# Patient Record
Sex: Male | Born: 1985 | Race: White | Hispanic: No | Marital: Single
Health system: Southern US, Community
[De-identification: ages and names within clinical notes are randomized; demographics above are authoritative.]

---

## 2018-08-04 ENCOUNTER — Emergency Department
Admission: EM | Admit: 2018-08-04 | Discharge: 2018-08-04 | Disposition: A | Discharge status: Home / Self Care | Payer: BLUE CROSS/BLUE SHIELD | Attending: Emergency Medicine | Admitting: Emergency Medicine

## 2018-08-04 ENCOUNTER — Emergency Department: Payer: BLUE CROSS/BLUE SHIELD

## 2018-08-04 ENCOUNTER — Encounter: Payer: Self-pay | Admitting: Emergency Medicine

## 2018-08-04 ENCOUNTER — Other Ambulatory Visit: Payer: Self-pay

## 2018-08-04 DIAGNOSIS — S29019A Strain of muscle and tendon of unspecified wall of thorax, initial encounter: Secondary | ICD-10-CM | POA: Insufficient documentation

## 2018-08-04 DIAGNOSIS — Y92410 Unspecified street and highway as the place of occurrence of the external cause: Secondary | ICD-10-CM | POA: Diagnosis not present

## 2018-08-04 DIAGNOSIS — Y9389 Activity, other specified: Secondary | ICD-10-CM | POA: Diagnosis not present

## 2018-08-04 DIAGNOSIS — Y999 Unspecified external cause status: Secondary | ICD-10-CM | POA: Insufficient documentation

## 2018-08-04 DIAGNOSIS — T148XXA Other injury of unspecified body region, initial encounter: Secondary | ICD-10-CM

## 2018-08-04 DIAGNOSIS — S299XXA Unspecified injury of thorax, initial encounter: Secondary | ICD-10-CM | POA: Diagnosis present

## 2018-08-04 MED ORDER — KETOROLAC TROMETHAMINE 30 MG/ML IJ SOLN
30.0000 mg | Freq: Once | INTRAMUSCULAR | Status: AC
  Administered 2018-08-04: 30 mg via INTRAMUSCULAR
  Filled 2018-08-04: qty 1

## 2018-08-04 MED ORDER — LIDOCAINE 5 % EX PTCH: 1.0000 | MEDICATED_PATCH | Freq: Two times a day (BID) | CUTANEOUS | 0 refills | Status: AC

## 2018-08-04 MED ORDER — HYDROCODONE-ACETAMINOPHEN 5-325 MG PO TABS: 1.0000 | ORAL_TABLET | Freq: Four times a day (QID) | ORAL | 0 refills | Status: AC | PRN

## 2018-08-04 MED ORDER — IBUPROFEN 600 MG PO TABS: 600.0000 mg | ORAL_TABLET | Freq: Three times a day (TID) | ORAL | 0 refills | Status: AC | PRN

## 2018-08-04 MED ORDER — LIDOCAINE 5 % EX PTCH
1.0000 | MEDICATED_PATCH | Freq: Once | CUTANEOUS | Status: DC
  Administered 2018-08-04: 1 via TRANSDERMAL
  Filled 2018-08-04: qty 1

## 2018-08-04 MED ORDER — HYDROCODONE-ACETAMINOPHEN 5-325 MG PO TABS
2.0000 | ORAL_TABLET | Freq: Once | ORAL | Status: AC
  Administered 2018-08-04: 2 via ORAL
  Filled 2018-08-04: qty 2
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
## 2018-08-04 NOTE — ED Notes (Signed)
Patient discharged to home per MD order. Patient in stable condition, and deemed medically cleared by ED provider for discharge. Discharge instructions reviewed with patient/family using "Teach Back"; verbalized understanding of medication education and administration, and information about follow-up care. Denies further concerns. ° °
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
## 2023-06-22 ENCOUNTER — Emergency Department: Payer: Self-pay

## 2023-06-22 ENCOUNTER — Other Ambulatory Visit: Payer: Self-pay

## 2023-06-22 ENCOUNTER — Emergency Department
Admission: EM | Admit: 2023-06-22 | Discharge: 2023-06-22 | Disposition: A | Discharge status: Home / Self Care | Payer: Self-pay | Attending: Emergency Medicine | Admitting: Emergency Medicine

## 2023-06-22 DIAGNOSIS — N200 Calculus of kidney: Secondary | ICD-10-CM | POA: Insufficient documentation

## 2023-06-22 LAB — CBC WITH DIFFERENTIAL/PLATELET
Abs Immature Granulocytes: 0.02 10*3/uL (ref 0.00–0.07)
Basophils Absolute: 0.1 10*3/uL (ref 0.0–0.1)
Basophils Relative: 1 %
Eosinophils Absolute: 0.1 10*3/uL (ref 0.0–0.5)
Eosinophils Relative: 2 %
HCT: 45.1 % (ref 39.0–52.0)
Hemoglobin: 16 g/dL (ref 13.0–17.0)
Immature Granulocytes: 0 %
Lymphocytes Relative: 20 %
Lymphs Abs: 1.5 10*3/uL (ref 0.7–4.0)
MCH: 34.9 pg — ABNORMAL HIGH (ref 26.0–34.0)
MCHC: 35.5 g/dL (ref 30.0–36.0)
MCV: 98.3 fL (ref 80.0–100.0)
Monocytes Absolute: 0.8 10*3/uL (ref 0.1–1.0)
Monocytes Relative: 10 %
Neutro Abs: 5 10*3/uL (ref 1.7–7.7)
Neutrophils Relative %: 67 %
Platelets: 108 10*3/uL — ABNORMAL LOW (ref 150–400)
RBC: 4.59 MIL/uL (ref 4.22–5.81)
RDW: 12.8 % (ref 11.5–15.5)
WBC: 7.4 10*3/uL (ref 4.0–10.5)
nRBC: 0 % (ref 0.0–0.2)

## 2023-06-22 LAB — COMPREHENSIVE METABOLIC PANEL
ALT: 62 U/L — ABNORMAL HIGH (ref 0–44)
AST: 104 U/L — ABNORMAL HIGH (ref 15–41)
Albumin: 4 g/dL (ref 3.5–5.0)
Alkaline Phosphatase: 106 U/L (ref 38–126)
Anion gap: 10 (ref 5–15)
BUN: 15 mg/dL (ref 6–20)
CO2: 26 mmol/L (ref 22–32)
Calcium: 9.5 mg/dL (ref 8.9–10.3)
Chloride: 98 mmol/L (ref 98–111)
Creatinine, Ser: 0.86 mg/dL (ref 0.61–1.24)
GFR, Estimated: >60 mL/min (ref >60)
Glucose, Bld: 187 mg/dL — ABNORMAL HIGH (ref 70–99)
Potassium: 3.6 mmol/L (ref 3.5–5.1)
Sodium: 134 mmol/L — ABNORMAL LOW (ref 135–145)
Total Bilirubin: 4.6 mg/dL — ABNORMAL HIGH (ref 0.3–1.2)
Total Protein: 8.6 g/dL — ABNORMAL HIGH (ref 6.5–8.1)

## 2023-06-22 LAB — URINALYSIS, ROUTINE W REFLEX MICROSCOPIC
Bacteria, UA: NONE SEEN
Bilirubin Urine: NEGATIVE
Glucose, UA: NEGATIVE mg/dL
Ketones, ur: NEGATIVE mg/dL
Leukocytes,Ua: NEGATIVE
Nitrite: NEGATIVE
Protein, ur: 100 mg/dL — AB
RBC / HPF: >50 RBC/hpf (ref 0–5)
Specific Gravity, Urine: 1.025 (ref 1.005–1.030)
pH: 7 (ref 5.0–8.0)

## 2023-06-22 MED ORDER — KETOROLAC TROMETHAMINE 30 MG/ML IJ SOLN
30.0000 mg | Freq: Once | INTRAMUSCULAR | Status: AC
  Administered 2023-06-22: 30 mg via INTRAMUSCULAR
  Filled 2023-06-22: qty 1

## 2023-06-22 MED ORDER — OXYCODONE-ACETAMINOPHEN 5-325 MG PO TABS: 1.0000 | ORAL_TABLET | Freq: Three times a day (TID) | ORAL | 0 refills | Status: AC | PRN

## 2023-06-22 MED ORDER — ONDANSETRON 4 MG PO TBDP: 4.0000 mg | ORAL_TABLET | Freq: Three times a day (TID) | ORAL | 0 refills | Status: AC | PRN

## 2023-06-22 MED ORDER — NAPROXEN 500 MG PO TABS: 500.0000 mg | ORAL_TABLET | Freq: Two times a day (BID) | ORAL | 2 refills | Status: AC

## 2023-06-22 MED ORDER — TAMSULOSIN HCL 0.4 MG PO CAPS: 0.4000 mg | ORAL_CAPSULE | Freq: Every day | ORAL | 0 refills | Status: AC

## 2023-06-22 NOTE — ED Provider Notes (Signed)
[facility] Provider Note    Event Date/Time   First MD Initiated Contact with Patient 06/22/23 0234     (approximate)   History   Abdominal Pain and Groin Pain   HPI  Mathew Williams is a 37 y.o. male who presents with complaints of right flank pain rating into his groin.  This started a couple of days ago but became worse today.  No history of kidney stones.  No dysuria.  No fevers.     Physical Exam   Triage Vital Signs: ED Triage Vitals [06/22/23 0103]  Encounter Vitals Group     BP (!) 157/101     Systolic BP Percentile      Diastolic BP Percentile      Pulse Rate (!) 113     Resp 18     Temp 98.1 F (36.7 C)     Temp Source Oral     SpO2 96 %     Weight (!) 158.8 kg (350 lb)     Height 1.956 m (6\' 5" )     Head Circumference      Peak Flow      Pain Score 10     Pain Loc      Pain Education      Exclude from Growth Chart     Most recent vital signs: Vitals:   06/22/23 0103  BP: (!) 157/101  Pulse: (!) 113  Resp: 18  Temp: 98.1 F (36.7 C)  SpO2: 96%     General: Awake, no distress.  CV:  Good peripheral perfusion.  Resp:  Normal effort.  Abd:  No distention.  No CVA tenderness, no tenderness to palpation of the abdomen. Other:     ED Results / Procedures / Treatments   Labs (all labs ordered are listed, but only abnormal results are displayed) Labs Reviewed  URINALYSIS, ROUTINE W REFLEX MICROSCOPIC - Abnormal; Notable for the following components:      Result Value   Color, Urine AMBER (*)    APPearance HAZY (*)    Hgb urine dipstick LARGE (*)    Protein, ur 100 (*)    All other components within normal limits  COMPREHENSIVE METABOLIC PANEL - Abnormal; Notable for the following components:   Sodium 134 (*)    Glucose, Bld 187 (*)    Total Protein 8.6 (*)    AST 104 (*)    ALT 62 (*)    Total Bilirubin 4.6 (*)    All other components within normal limits  CBC WITH DIFFERENTIAL/PLATELET - Abnormal; Notable for  the following components:   MCH 34.9 (*)    Platelets 108 (*)    All other components within normal limits     EKG     RADIOLOGY CT renal stone study demonstrates 3 mm distal ureteral stone    PROCEDURES:  Critical Care performed:   Procedures   MEDICATIONS ORDERED IN ED: Medications  ketorolac (TORADOL) 30 MG/ML injection 30 mg (30 mg Intramuscular Given 06/22/23 0238)     IMPRESSION / MDM / ASSESSMENT AND PLAN / ED COURSE  I reviewed the triage vital signs and the nursing notes. Patient's presentation is most consistent with acute presentation with potential threat to life or bodily function.  Patient presents with right flank pain as detailed above.  Differential includes kidney stone, UTI, doubt appendicitis  Will treat with IM Toradol, obtain CT renal stone study and reevaluate  Lab work is overall reassuring, urinalysis without signs  of infection  CT scan demonstrates 3 mm stone, this is likely the cause of his pain, he is improved after Toradol  Appropriate for discharge with outpatient follow-up with urology, prescriptions provided      FINAL CLINICAL IMPRESSION(S) / ED DIAGNOSES   Final diagnoses:  Kidney stone     Rx / DC Orders   ED Discharge Orders          Ordered    naproxen (NAPROSYN) 500 MG tablet  2 times daily with meals        06/22/23 0306    oxyCODONE-acetaminophen (PERCOCET) 5-325 MG tablet  Every 8 hours PRN        06/22/23 0306    ondansetron (ZOFRAN-ODT) 4 MG disintegrating tablet  Every 8 hours PRN        06/22/23 0306    tamsulosin (FLOMAX) 0.4 MG CAPS capsule  Daily        06/22/23 0306             Note:  This document was prepared using Dragon voice recognition software and may include unintentional dictation errors.   Jene Every, MD 06/22/23 [phone]

## 2023-06-22 NOTE — ED Triage Notes (Signed)
Patient ambulatory to triage with complaints of right sided abdominal pain going down into the groin that has become unbearable in the last couple of hours. States over the last couple of days he had has some flank pain but now has moved. Patient states he feels like he really needs to pee but cannot and has had difficulty starting to pee since this morning. Denies hx of kidney stones
